# Patient Record
Sex: Female | Born: 1970 | Race: White | Hispanic: No | Marital: Married | Smoking: Never smoker
Health system: Southern US, Community
[De-identification: ages and names within clinical notes are randomized; demographics above are authoritative.]

## PROBLEM LIST (undated history)

## (undated) DIAGNOSIS — R519 Headache, unspecified: Secondary | ICD-10-CM

## (undated) DIAGNOSIS — R51 Headache: Secondary | ICD-10-CM

## (undated) DIAGNOSIS — R112 Nausea with vomiting, unspecified: Secondary | ICD-10-CM

## (undated) DIAGNOSIS — N649 Disorder of breast, unspecified: Secondary | ICD-10-CM

## (undated) DIAGNOSIS — Z9889 Other specified postprocedural states: Secondary | ICD-10-CM

## (undated) HISTORY — PX: WISDOM TOOTH EXTRACTION: SHX21

---

## 2006-05-02 ENCOUNTER — Encounter: Admission: RE | Admit: 2006-05-02 | Discharge: 2006-05-02 | Payer: Self-pay | Admitting: Family Medicine

## 2007-06-05 ENCOUNTER — Encounter: Admission: RE | Admit: 2007-06-05 | Discharge: 2007-06-05 | Payer: Self-pay | Admitting: Family Medicine

## 2007-06-12 ENCOUNTER — Encounter: Admission: RE | Admit: 2007-06-12 | Discharge: 2007-06-12 | Payer: Self-pay | Admitting: Family Medicine

## 2007-12-26 ENCOUNTER — Encounter: Admission: RE | Admit: 2007-12-26 | Discharge: 2007-12-26 | Payer: Self-pay | Admitting: Family Medicine

## 2008-07-16 ENCOUNTER — Encounter: Admission: RE | Admit: 2008-07-16 | Discharge: 2008-07-16 | Payer: Self-pay | Admitting: Family Medicine

## 2012-08-01 ENCOUNTER — Other Ambulatory Visit: Payer: Self-pay | Admitting: Obstetrics and Gynecology

## 2012-08-07 ENCOUNTER — Other Ambulatory Visit: Payer: Self-pay | Admitting: *Deleted

## 2012-08-07 DIAGNOSIS — N63 Unspecified lump in unspecified breast: Secondary | ICD-10-CM

## 2012-08-14 ENCOUNTER — Other Ambulatory Visit: Payer: Self-pay | Admitting: *Deleted

## 2012-08-14 ENCOUNTER — Ambulatory Visit
Admission: RE | Admit: 2012-08-14 | Discharge: 2012-08-14 | Disposition: A | Discharge status: Home / Self Care | Payer: BC Managed Care – PPO | Source: Ambulatory Visit | Attending: *Deleted | Admitting: *Deleted

## 2012-08-14 DIAGNOSIS — N63 Unspecified lump in unspecified breast: Secondary | ICD-10-CM

## 2012-08-22 ENCOUNTER — Ambulatory Visit
Admission: RE | Admit: 2012-08-22 | Discharge: 2012-08-22 | Disposition: A | Discharge status: Home / Self Care | Payer: BC Managed Care – PPO | Source: Ambulatory Visit | Attending: *Deleted | Admitting: *Deleted

## 2012-08-22 DIAGNOSIS — N63 Unspecified lump in unspecified breast: Secondary | ICD-10-CM

## 2013-08-06 ENCOUNTER — Other Ambulatory Visit: Payer: Self-pay

## 2013-08-06 DIAGNOSIS — Z1231 Encounter for screening mammogram for malignant neoplasm of breast: Secondary | ICD-10-CM

## 2013-09-03 ENCOUNTER — Ambulatory Visit
Admission: RE | Admit: 2013-09-03 | Discharge: 2013-09-03 | Disposition: A | Discharge status: Home / Self Care | Payer: BC Managed Care – PPO | Source: Ambulatory Visit

## 2013-09-03 DIAGNOSIS — Z1231 Encounter for screening mammogram for malignant neoplasm of breast: Secondary | ICD-10-CM

## 2013-09-04 ENCOUNTER — Other Ambulatory Visit: Payer: Self-pay | Admitting: Obstetrics & Gynecology

## 2013-09-04 DIAGNOSIS — R928 Other abnormal and inconclusive findings on diagnostic imaging of breast: Secondary | ICD-10-CM

## 2013-09-24 ENCOUNTER — Ambulatory Visit
Admission: RE | Admit: 2013-09-24 | Discharge: 2013-09-24 | Disposition: A | Discharge status: Home / Self Care | Payer: BC Managed Care – PPO | Source: Ambulatory Visit | Attending: Obstetrics & Gynecology | Admitting: Obstetrics & Gynecology

## 2013-09-24 DIAGNOSIS — R928 Other abnormal and inconclusive findings on diagnostic imaging of breast: Secondary | ICD-10-CM

## 2014-02-19 ENCOUNTER — Other Ambulatory Visit: Payer: Self-pay | Admitting: Obstetrics & Gynecology

## 2014-02-19 DIAGNOSIS — N6459 Other signs and symptoms in breast: Secondary | ICD-10-CM

## 2014-03-18 ENCOUNTER — Ambulatory Visit
Admission: RE | Admit: 2014-03-18 | Discharge: 2014-03-18 | Disposition: A | Discharge status: Home / Self Care | Payer: BC Managed Care – PPO | Source: Ambulatory Visit | Attending: Obstetrics & Gynecology | Admitting: Obstetrics & Gynecology

## 2014-03-18 ENCOUNTER — Other Ambulatory Visit: Payer: Self-pay | Admitting: Obstetrics & Gynecology

## 2014-03-18 DIAGNOSIS — N6459 Other signs and symptoms in breast: Secondary | ICD-10-CM

## 2014-03-20 ENCOUNTER — Other Ambulatory Visit: Payer: Self-pay | Admitting: Obstetrics & Gynecology

## 2014-03-20 DIAGNOSIS — R928 Other abnormal and inconclusive findings on diagnostic imaging of breast: Secondary | ICD-10-CM

## 2014-08-29 ENCOUNTER — Other Ambulatory Visit: Payer: Self-pay | Admitting: Obstetrics & Gynecology

## 2014-08-29 DIAGNOSIS — R928 Other abnormal and inconclusive findings on diagnostic imaging of breast: Secondary | ICD-10-CM

## 2014-09-04 ENCOUNTER — Ambulatory Visit
Admission: RE | Admit: 2014-09-04 | Discharge: 2014-09-04 | Disposition: A | Discharge status: Home / Self Care | Payer: BC Managed Care – PPO | Source: Ambulatory Visit | Attending: Obstetrics & Gynecology | Admitting: Obstetrics & Gynecology

## 2014-09-04 ENCOUNTER — Other Ambulatory Visit: Payer: Self-pay | Admitting: Obstetrics & Gynecology

## 2014-09-04 DIAGNOSIS — N6320 Unspecified lump in the left breast, unspecified quadrant: Secondary | ICD-10-CM

## 2014-09-04 DIAGNOSIS — R928 Other abnormal and inconclusive findings on diagnostic imaging of breast: Secondary | ICD-10-CM

## 2014-09-06 ENCOUNTER — Other Ambulatory Visit: Payer: Self-pay | Admitting: Obstetrics & Gynecology

## 2014-09-06 DIAGNOSIS — N6320 Unspecified lump in the left breast, unspecified quadrant: Secondary | ICD-10-CM

## 2014-09-10 ENCOUNTER — Ambulatory Visit
Admission: RE | Admit: 2014-09-10 | Discharge: 2014-09-10 | Disposition: A | Discharge status: Home / Self Care | Payer: BC Managed Care – PPO | Source: Ambulatory Visit | Attending: Obstetrics & Gynecology | Admitting: Obstetrics & Gynecology

## 2014-09-10 DIAGNOSIS — N6320 Unspecified lump in the left breast, unspecified quadrant: Secondary | ICD-10-CM

## 2014-10-18 HISTORY — PX: BREAST EXCISIONAL BIOPSY: SUR124

## 2015-01-15 ENCOUNTER — Other Ambulatory Visit: Payer: Self-pay

## 2015-01-15 DIAGNOSIS — N649 Disorder of breast, unspecified: Secondary | ICD-10-CM

## 2015-04-03 ENCOUNTER — Other Ambulatory Visit: Payer: Self-pay | Admitting: Obstetrics & Gynecology

## 2015-04-03 DIAGNOSIS — N649 Disorder of breast, unspecified: Secondary | ICD-10-CM

## 2015-04-22 ENCOUNTER — Other Ambulatory Visit: Payer: Self-pay | Admitting: Obstetrics & Gynecology

## 2015-04-22 ENCOUNTER — Ambulatory Visit
Admission: RE | Admit: 2015-04-22 | Discharge: 2015-04-22 | Disposition: A | Discharge status: Home / Self Care | Payer: BLUE CROSS/BLUE SHIELD | Source: Ambulatory Visit | Attending: Obstetrics & Gynecology | Admitting: Obstetrics & Gynecology

## 2015-04-22 ENCOUNTER — Ambulatory Visit
Admission: RE | Admit: 2015-04-22 | Discharge: 2015-04-22 | Disposition: A | Discharge status: Home / Self Care | Payer: BLUE CROSS/BLUE SHIELD | Source: Ambulatory Visit

## 2015-04-22 DIAGNOSIS — N649 Disorder of breast, unspecified: Secondary | ICD-10-CM

## 2015-06-09 ENCOUNTER — Other Ambulatory Visit: Payer: Self-pay | Admitting: General Surgery

## 2015-06-09 ENCOUNTER — Encounter (HOSPITAL_BASED_OUTPATIENT_CLINIC_OR_DEPARTMENT_OTHER): Payer: Self-pay | Admitting: *Deleted

## 2015-06-09 DIAGNOSIS — N6320 Unspecified lump in the left breast, unspecified quadrant: Secondary | ICD-10-CM

## 2015-07-04 ENCOUNTER — Ambulatory Visit
Admission: RE | Admit: 2015-07-04 | Discharge: 2015-07-04 | Disposition: A | Discharge status: Home / Self Care | Payer: BLUE CROSS/BLUE SHIELD | Source: Ambulatory Visit | Attending: General Surgery | Admitting: General Surgery

## 2015-07-04 ENCOUNTER — Encounter (HOSPITAL_BASED_OUTPATIENT_CLINIC_OR_DEPARTMENT_OTHER)
Admission: RE | Admit: 2015-07-04 | Discharge: 2015-07-04 | Disposition: A | Discharge status: Home / Self Care | Payer: BLUE CROSS/BLUE SHIELD | Source: Ambulatory Visit | Attending: General Surgery | Admitting: General Surgery

## 2015-07-04 DIAGNOSIS — N6320 Unspecified lump in the left breast, unspecified quadrant: Secondary | ICD-10-CM

## 2015-07-04 DIAGNOSIS — D0502 Lobular carcinoma in situ of left breast: Secondary | ICD-10-CM | POA: Diagnosis not present

## 2015-07-04 DIAGNOSIS — N63 Unspecified lump in breast: Secondary | ICD-10-CM | POA: Diagnosis present

## 2015-07-04 LAB — CBC WITH DIFFERENTIAL/PLATELET
Basophils Absolute: 0.1 10*3/uL (ref 0.0–0.1)
Basophils Relative: 1 %
Eosinophils Absolute: 0.1 10*3/uL (ref 0.0–0.7)
Eosinophils Relative: 2 %
HEMATOCRIT: 42.4 % (ref 36.0–46.0)
HEMOGLOBIN: 14.3 g/dL (ref 12.0–15.0)
LYMPHS PCT: 25 %
Lymphs Abs: 1.8 10*3/uL (ref 0.7–4.0)
MCH: 31.8 pg (ref 26.0–34.0)
MCHC: 33.7 g/dL (ref 30.0–36.0)
MCV: 94.4 fL (ref 78.0–100.0)
MONOS PCT: 9 %
Monocytes Absolute: 0.6 10*3/uL (ref 0.1–1.0)
NEUTROS ABS: 4.5 10*3/uL (ref 1.7–7.7)
NEUTROS PCT: 63 %
Platelets: 220 10*3/uL (ref 150–400)
RBC: 4.49 MIL/uL (ref 3.87–5.11)
RDW: 12.2 % (ref 11.5–15.5)
WBC: 7 10*3/uL (ref 4.0–10.5)

## 2015-07-04 LAB — BASIC METABOLIC PANEL
ANION GAP: 7 (ref 5–15)
BUN: 12 mg/dL (ref 6–20)
CALCIUM: 9.9 mg/dL (ref 8.9–10.3)
CHLORIDE: 104 mmol/L (ref 101–111)
CO2: 30 mmol/L (ref 22–32)
Creatinine, Ser: 0.96 mg/dL (ref 0.44–1.00)
GFR calc non Af Amer: >60 mL/min (ref >60)
GLUCOSE: 85 mg/dL (ref 65–99)
POTASSIUM: 3.9 mmol/L (ref 3.5–5.1)
Sodium: 141 mmol/L (ref 135–145)

## 2015-07-07 ENCOUNTER — Encounter (HOSPITAL_BASED_OUTPATIENT_CLINIC_OR_DEPARTMENT_OTHER)
Admission: RE | Disposition: A | Discharge status: Home / Self Care | Payer: Self-pay | Source: Ambulatory Visit | Attending: General Surgery

## 2015-07-07 ENCOUNTER — Ambulatory Visit (HOSPITAL_BASED_OUTPATIENT_CLINIC_OR_DEPARTMENT_OTHER): Payer: BLUE CROSS/BLUE SHIELD | Admitting: Certified Registered"

## 2015-07-07 ENCOUNTER — Ambulatory Visit
Admission: RE | Admit: 2015-07-07 | Discharge: 2015-07-07 | Disposition: A | Discharge status: Home / Self Care | Payer: BLUE CROSS/BLUE SHIELD | Source: Ambulatory Visit | Attending: General Surgery | Admitting: General Surgery

## 2015-07-07 ENCOUNTER — Ambulatory Visit (HOSPITAL_BASED_OUTPATIENT_CLINIC_OR_DEPARTMENT_OTHER)
Admission: RE | Admit: 2015-07-07 | Discharge: 2015-07-07 | Disposition: A | Discharge status: Home / Self Care | Payer: BLUE CROSS/BLUE SHIELD | Source: Ambulatory Visit | Attending: General Surgery | Admitting: General Surgery

## 2015-07-07 ENCOUNTER — Encounter (HOSPITAL_BASED_OUTPATIENT_CLINIC_OR_DEPARTMENT_OTHER): Payer: Self-pay

## 2015-07-07 DIAGNOSIS — N6320 Unspecified lump in the left breast, unspecified quadrant: Secondary | ICD-10-CM

## 2015-07-07 DIAGNOSIS — D0502 Lobular carcinoma in situ of left breast: Secondary | ICD-10-CM | POA: Diagnosis not present

## 2015-07-07 HISTORY — DX: Nausea with vomiting, unspecified: R11.2

## 2015-07-07 HISTORY — DX: Headache: R51

## 2015-07-07 HISTORY — PX: RADIOACTIVE SEED GUIDED EXCISIONAL BREAST BIOPSY: SHX6490

## 2015-07-07 HISTORY — DX: Headache, unspecified: R51.9

## 2015-07-07 HISTORY — DX: Other specified postprocedural states: Z98.890

## 2015-07-07 HISTORY — DX: Disorder of breast, unspecified: N64.9

## 2015-07-07 SURGERY — RADIOACTIVE SEED GUIDED BREAST BIOPSY
Anesthesia: General | Site: Breast | Laterality: Left

## 2015-07-07 MED ORDER — FENTANYL CITRATE (PF) 100 MCG/2ML IJ SOLN
50.0000 ug | INTRAMUSCULAR | Status: AC | PRN
  Administered 2015-07-07: 50 ug via INTRAVENOUS
  Administered 2015-07-07 (×2): 25 ug via INTRAVENOUS

## 2015-07-07 MED ORDER — ONDANSETRON HCL 4 MG/2ML IJ SOLN
INTRAMUSCULAR | Status: AC
  Filled 2015-07-07: qty 2

## 2015-07-07 MED ORDER — CEFAZOLIN SODIUM-DEXTROSE 2-3 GM-% IV SOLR
INTRAVENOUS | Status: AC
  Filled 2015-07-07: qty 50

## 2015-07-07 MED ORDER — GLYCOPYRROLATE 0.2 MG/ML IJ SOLN: 0.2000 mg | Freq: Once | INTRAMUSCULAR | Status: DC | PRN

## 2015-07-07 MED ORDER — LIDOCAINE HCL (CARDIAC) 20 MG/ML IV SOLN
INTRAVENOUS | Status: AC
  Filled 2015-07-07: qty 5

## 2015-07-07 MED ORDER — SCOPOLAMINE 1 MG/3DAYS TD PT72
1.0000 | MEDICATED_PATCH | Freq: Once | TRANSDERMAL | Status: DC | PRN
  Administered 2015-07-07: 1.5 mg via TRANSDERMAL

## 2015-07-07 MED ORDER — OXYCODONE HCL 5 MG PO TABS
ORAL_TABLET | ORAL | Status: AC
  Filled 2015-07-07: qty 1

## 2015-07-07 MED ORDER — BUPIVACAINE HCL (PF) 0.25 % IJ SOLN
INTRAMUSCULAR | Status: AC
  Filled 2015-07-07: qty 30

## 2015-07-07 MED ORDER — CEFAZOLIN SODIUM-DEXTROSE 2-3 GM-% IV SOLR
2.0000 g | INTRAVENOUS | Status: AC
Start: 2015-07-07 — End: 2015-07-07
  Administered 2015-07-07: 2 g via INTRAVENOUS

## 2015-07-07 MED ORDER — DEXAMETHASONE SODIUM PHOSPHATE 10 MG/ML IJ SOLN
INTRAMUSCULAR | Status: AC
  Filled 2015-07-07: qty 1

## 2015-07-07 MED ORDER — PROPOFOL 10 MG/ML IV BOLUS
INTRAVENOUS | Status: DC | PRN
  Administered 2015-07-07: 200 mg via INTRAVENOUS

## 2015-07-07 MED ORDER — MEPERIDINE HCL 25 MG/ML IJ SOLN: 6.2500 mg | INTRAMUSCULAR | Status: DC | PRN

## 2015-07-07 MED ORDER — HYDROMORPHONE HCL 1 MG/ML IJ SOLN
0.2500 mg | INTRAMUSCULAR | Status: DC | PRN
  Administered 2015-07-07: 0.5 mg via INTRAVENOUS

## 2015-07-07 MED ORDER — ONDANSETRON HCL 4 MG/2ML IJ SOLN
INTRAMUSCULAR | Status: DC | PRN
  Administered 2015-07-07: 4 mg via INTRAVENOUS

## 2015-07-07 MED ORDER — MIDAZOLAM HCL 2 MG/2ML IJ SOLN
INTRAMUSCULAR | Status: AC
  Filled 2015-07-07: qty 4

## 2015-07-07 MED ORDER — LACTATED RINGERS IV SOLN
INTRAVENOUS | Status: DC
  Administered 2015-07-07 (×2): via INTRAVENOUS

## 2015-07-07 MED ORDER — OXYCODONE HCL 5 MG PO TABS
5.0000 mg | ORAL_TABLET | Freq: Once | ORAL | Status: AC | PRN
  Administered 2015-07-07: 5 mg via ORAL

## 2015-07-07 MED ORDER — HYDROMORPHONE HCL 1 MG/ML IJ SOLN
INTRAMUSCULAR | Status: AC
  Filled 2015-07-07: qty 1

## 2015-07-07 MED ORDER — HYDROCODONE-ACETAMINOPHEN 10-325 MG PO TABS: 1.0000 | ORAL_TABLET | Freq: Four times a day (QID) | ORAL | Status: DC | PRN

## 2015-07-07 MED ORDER — SCOPOLAMINE 1 MG/3DAYS TD PT72
MEDICATED_PATCH | TRANSDERMAL | Status: AC
  Filled 2015-07-07: qty 1

## 2015-07-07 MED ORDER — MIDAZOLAM HCL 2 MG/2ML IJ SOLN
1.0000 mg | INTRAMUSCULAR | Status: DC | PRN
  Administered 2015-07-07: 2 mg via INTRAVENOUS

## 2015-07-07 MED ORDER — LIDOCAINE HCL (CARDIAC) 20 MG/ML IV SOLN
INTRAVENOUS | Status: DC | PRN
  Administered 2015-07-07: 60 mg via INTRAVENOUS

## 2015-07-07 MED ORDER — OXYCODONE HCL 5 MG/5ML PO SOLN: 5.0000 mg | Freq: Once | ORAL | Status: AC | PRN

## 2015-07-07 MED ORDER — PROPOFOL 500 MG/50ML IV EMUL
INTRAVENOUS | Status: AC
  Filled 2015-07-07: qty 50

## 2015-07-07 MED ORDER — FENTANYL CITRATE (PF) 100 MCG/2ML IJ SOLN
INTRAMUSCULAR | Status: AC
  Filled 2015-07-07: qty 4

## 2015-07-07 MED ORDER — BUPIVACAINE HCL (PF) 0.25 % IJ SOLN
INTRAMUSCULAR | Status: DC | PRN
  Administered 2015-07-07: 20 mL

## 2015-07-07 MED ORDER — DEXAMETHASONE SODIUM PHOSPHATE 4 MG/ML IJ SOLN
INTRAMUSCULAR | Status: DC | PRN
  Administered 2015-07-07: 10 mg via INTRAVENOUS

## 2015-07-07 SURGICAL SUPPLY — 63 items
APPLIER CLIP 9.375 MED OPEN (MISCELLANEOUS)
APR CLP MED 9.3 20 MLT OPN (MISCELLANEOUS)
BINDER BREAST LRG (GAUZE/BANDAGES/DRESSINGS) IMPLANT
BINDER BREAST MEDIUM (GAUZE/BANDAGES/DRESSINGS) ×2 IMPLANT
BINDER BREAST XLRG (GAUZE/BANDAGES/DRESSINGS) IMPLANT
BINDER BREAST XXLRG (GAUZE/BANDAGES/DRESSINGS) IMPLANT
BLADE SURG 15 STRL LF DISP TIS (BLADE) ×1 IMPLANT
BLADE SURG 15 STRL SS (BLADE) ×3
CANISTER SUC SOCK COL 7IN (MISCELLANEOUS) IMPLANT
CANISTER SUCT 1200ML W/VALVE (MISCELLANEOUS) IMPLANT
CHLORAPREP W/TINT 26ML (MISCELLANEOUS) ×3 IMPLANT
CLIP APPLIE 9.375 MED OPEN (MISCELLANEOUS) IMPLANT
CLOSURE WOUND 1/2 X4 (GAUZE/BANDAGES/DRESSINGS) ×1
COVER BACK TABLE 60X90IN (DRAPES) ×3 IMPLANT
COVER MAYO STAND STRL (DRAPES) ×3 IMPLANT
COVER PROBE W GEL 5X96 (DRAPES) ×3 IMPLANT
DECANTER SPIKE VIAL GLASS SM (MISCELLANEOUS) IMPLANT
DEVICE DUBIN W/COMP PLATE 8390 (MISCELLANEOUS) ×3 IMPLANT
DRAPE LAPAROSCOPIC ABDOMINAL (DRAPES) ×3 IMPLANT
DRAPE UTILITY XL STRL (DRAPES) ×2 IMPLANT
DRSG TEGADERM 4X4.75 (GAUZE/BANDAGES/DRESSINGS) IMPLANT
ELECT COATED BLADE 2.86 ST (ELECTRODE) ×3 IMPLANT
ELECT REM PT RETURN 9FT ADLT (ELECTROSURGICAL) ×3
ELECTRODE REM PT RTRN 9FT ADLT (ELECTROSURGICAL) ×1 IMPLANT
GLOVE BIO SURGEON STRL SZ7 (GLOVE) ×6 IMPLANT
GLOVE BIOGEL PI IND STRL 7.0 (GLOVE) IMPLANT
GLOVE BIOGEL PI IND STRL 7.5 (GLOVE) ×1 IMPLANT
GLOVE BIOGEL PI INDICATOR 7.0 (GLOVE) ×2
GLOVE BIOGEL PI INDICATOR 7.5 (GLOVE) ×2
GLOVE ECLIPSE 6.5 STRL STRAW (GLOVE) ×2 IMPLANT
GLOVE EXAM NITRILE EXT CUFF MD (GLOVE) ×2 IMPLANT
GOWN STRL REUS W/ TWL LRG LVL3 (GOWN DISPOSABLE) ×2 IMPLANT
GOWN STRL REUS W/TWL LRG LVL3 (GOWN DISPOSABLE) ×6
ILLUMINATOR WAVEGUIDE N/F (MISCELLANEOUS) ×2 IMPLANT
KIT MARKER MARGIN INK (KITS) ×3 IMPLANT
LIGHT WAVEGUIDE WIDE FLAT (MISCELLANEOUS) IMPLANT
LIQUID BAND (GAUZE/BANDAGES/DRESSINGS) ×3 IMPLANT
MARKER SKIN DUAL TIP RULER LAB (MISCELLANEOUS) ×3 IMPLANT
NDL HYPO 25X1 1.5 SAFETY (NEEDLE) ×1 IMPLANT
NEEDLE HYPO 25X1 1.5 SAFETY (NEEDLE) ×3 IMPLANT
NS IRRIG 1000ML POUR BTL (IV SOLUTION) ×2 IMPLANT
PACK BASIN DAY SURGERY FS (CUSTOM PROCEDURE TRAY) ×3 IMPLANT
PENCIL BUTTON HOLSTER BLD 10FT (ELECTRODE) ×3 IMPLANT
SHEET MEDIUM DRAPE 40X70 STRL (DRAPES) IMPLANT
SLEEVE SCD COMPRESS KNEE MED (MISCELLANEOUS) ×3 IMPLANT
SPONGE GAUZE 4X4 12PLY STER LF (GAUZE/BANDAGES/DRESSINGS) IMPLANT
SPONGE LAP 4X18 X RAY DECT (DISPOSABLE) ×3 IMPLANT
STAPLER VISISTAT 35W (STAPLE) IMPLANT
STRIP CLOSURE SKIN 1/2X4 (GAUZE/BANDAGES/DRESSINGS) ×2 IMPLANT
SUT MNCRL AB 4-0 PS2 18 (SUTURE) ×2 IMPLANT
SUT MON AB 5-0 PS2 18 (SUTURE) IMPLANT
SUT SILK 2 0 SH (SUTURE) IMPLANT
SUT VIC AB 2-0 SH 27 (SUTURE) ×3
SUT VIC AB 2-0 SH 27XBRD (SUTURE) ×1 IMPLANT
SUT VIC AB 3-0 SH 27 (SUTURE) ×3
SUT VIC AB 3-0 SH 27X BRD (SUTURE) ×1 IMPLANT
SUT VIC AB 5-0 PS2 18 (SUTURE) ×2 IMPLANT
SYR CONTROL 10ML LL (SYRINGE) ×3 IMPLANT
TOWEL OR 17X24 6PK STRL BLUE (TOWEL DISPOSABLE) ×3 IMPLANT
TOWEL OR NON WOVEN STRL DISP B (DISPOSABLE) ×3 IMPLANT
TUBE CONNECTING 20'X1/4 (TUBING)
TUBE CONNECTING 20X1/4 (TUBING) IMPLANT
YANKAUER SUCT BULB TIP NO VENT (SUCTIONS) IMPLANT

## 2015-07-07 NOTE — Anesthesia Procedure Notes (Signed)
Procedure Name: LMA Insertion Date/Time: 07/07/2015 7:33 AM Performed by: BLOCKER, TIMOTHY D Pre-anesthesia Checklist: Patient identified, Emergency Drugs available, Suction available and Patient being monitored Patient Re-evaluated:Patient Re-evaluated prior to inductionOxygen Delivery Method: Circle System Utilized Preoxygenation: Pre-oxygenation with 100% oxygen Intubation Type: IV induction Ventilation: Mask ventilation without difficulty LMA: LMA inserted LMA Size: 4.0 Number of attempts: 1 Airway Equipment and Method: Bite block Placement Confirmation: positive ETCO2 Tube secured with: Tape Dental Injury: Teeth and Oropharynx as per pre-operative assessment

## 2015-07-07 NOTE — Anesthesia Postprocedure Evaluation (Signed)
  Anesthesia Post-op Note  Patient: Darlene Perez  Procedure(s) Performed: Procedure(s): RADIOACTIVE SEED GUIDED EXCISIONAL LEFT BREAST BIOPSY (Left)  Patient Location: PACU  Anesthesia Type: General   Level of Consciousness: awake, alert  and oriented  Airway and Oxygen Therapy: Patient Spontanous Breathing  Post-op Pain: mild  Post-op Assessment: Post-op Vital signs reviewed  Post-op Vital Signs: Reviewed  Last Vitals:  Filed Vitals:   07/07/15 1015  BP: 125/80  Pulse: 75  Temp: 36.9 C  Resp: 16    Complications: No apparent anesthesia complications

## 2015-07-07 NOTE — Transfer of Care (Signed)
Immediate Anesthesia Transfer of Care Note  Patient: Darlene Perez  Procedure(s) Performed: Procedure(s): RADIOACTIVE SEED GUIDED EXCISIONAL LEFT BREAST BIOPSY (Left)  Patient Location: PACU  Anesthesia Type:General  Level of Consciousness: awake, alert , oriented and patient cooperative  Airway & Oxygen Therapy: Patient Spontanous Breathing and Patient connected to face mask oxygen  Post-op Assessment: Report given to RN and Post -op Vital signs reviewed and stable  Post vital signs: Reviewed and stable  Last Vitals:  Filed Vitals:   07/07/15 0825  BP: 127/77  Pulse:   Temp:   Resp:     Complications: No apparent anesthesia complications

## 2015-07-07 NOTE — Anesthesia Preprocedure Evaluation (Addendum)
Anesthesia Evaluation  Patient identified by MRN, date of birth, ID band Patient awake    Reviewed: Allergy & Precautions, NPO status , Patient's Chart, lab work & pertinent test results  History of Anesthesia Complications (+) PONV and history of anesthetic complications  Airway Mallampati: I  TM Distance: >3 FB Neck ROM: Full    Dental  (+) Teeth Intact, Dental Advisory Given   Pulmonary    breath sounds clear to auscultation       Cardiovascular  Rhythm:Regular Rate:Normal     Neuro/Psych  Headaches,    GI/Hepatic   Endo/Other    Renal/GU      Musculoskeletal   Abdominal   Peds  Hematology   Anesthesia Other Findings   Reproductive/Obstetrics                            Anesthesia Physical Anesthesia Plan  ASA: II  Anesthesia Plan: General   Post-op Pain Management:    Induction: Intravenous  Airway Management Planned: LMA  Additional Equipment:   Intra-op Plan:   Post-operative Plan: Extubation in OR  Informed Consent: I have reviewed the patients History and Physical, chart, labs and discussed the procedure including the risks, benefits and alternatives for the proposed anesthesia with the patient or authorized representative who has indicated his/her understanding and acceptance.   Dental advisory given  Plan Discussed with: CRNA, Anesthesiologist and Surgeon  Anesthesia Plan Comments:         Anesthesia Quick Evaluation

## 2015-07-07 NOTE — H&P (Signed)
44 yof with history of left breast cysts, no fh of breast or ovarian cancer presents after undergoing screening mm 6/15 that showed a left breast distortion in uoq. US showed a 2.2 cm area. she underwent abbreviated mr in [place] that showed nl right breast, left breast with 3.2x4.4x2 cm area of NME centered at 12 o clock. this is 3.8 cm anterior to CW, 1.7 cm deep to skin and is 3.5 cm from nipple. nodes negative. 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 seen by one of my partners previously and decided to not have surgery as discussed for a number of reasons. repeat mm this year in July shows a 4.6x4.2x3.7 cm area of distortion in the upper left breast. on [place] there is a 2.7x2.7x2.5 cm area of abnormality. nodes again negative. she does not really identify a mass, no nipple discharge. presents for conversation on excision   Other Problems Illene Regulus, CMA; 06/09/2015 1:49 PM) Hemorrhoids Lump In Breast Melanoma Migraine Headache  Past Surgical History Illene Regulus, CMA; 06/09/2015 1:49 PM) Breast Biopsy Left. Cesarean Section - 1 Oral Surgery  Diagnostic Studies History Illene Regulus, CMA; 06/09/2015 1:49 PM) Colonoscopy never Mammogram within last year Pap Smear 1-5 years ago  Medication History Illene Regulus, CMA; 06/09/2015 1:50 PM) No Current Medications Medications Reconciled  Social History Illene Regulus, CMA; 06/09/2015 1:49 PM) Alcohol use Occasional alcohol use. Caffeine use Carbonated beverages. No drug use Tobacco use Never smoker.  Family History Illene Regulus, CMA; 06/09/2015 1:49 PM) Arthritis Father. Hypertension Brother, Father, Mother, Sister.  Pregnancy / Birth History Illene Regulus, CMA; 06/09/2015 1:49 PM) Age at menarche 44 years. Contraceptive History Oral contraceptives. Gravida 2 Maternal age 30-30 Para 1 Regular periods  Review of Systems Lars Mage Spillers CMA; 06/09/2015 1:49  PM) General Not Present- Appetite Loss, Chills, Fatigue, Fever, Night Sweats, Weight Gain and Weight Loss. Skin Not Present- Change in Wart/Mole, Dryness, Hives, Jaundice, New Lesions, Non-Healing Wounds, Rash and Ulcer. HEENT Not Present- Earache, Hearing Loss, Hoarseness, Nose Bleed, Oral Ulcers, Ringing in the Ears, Seasonal Allergies, Sinus Pain, Sore Throat, Visual Disturbances, Wears glasses/contact lenses and Yellow Eyes. Respiratory Not Present- Bloody sputum, Chronic Cough, Difficulty Breathing, Snoring and Wheezing. Breast Present- Breast Mass. Not Present- Breast Pain, Nipple Discharge and Skin Changes. Cardiovascular Not Present- Chest Pain, Difficulty Breathing Lying Down, Leg Cramps, Palpitations, Rapid Heart Rate, Shortness of Breath and Swelling of Extremities. Gastrointestinal Not Present- Abdominal Pain, Bloating, Bloody Stool, Change in Bowel Habits, Chronic diarrhea, Constipation, Difficulty Swallowing, Excessive gas, Gets full quickly at meals, Hemorrhoids, Indigestion, Nausea, Rectal Pain and Vomiting. Female Genitourinary Not Present- Frequency, Nocturia, Painful Urination, Pelvic Pain and Urgency. Musculoskeletal Not Present- Back Pain, Joint Pain, Joint Stiffness, Muscle Pain, Muscle Weakness and Swelling of Extremities. Neurological Not Present- Decreased Memory, Fainting, Headaches, Numbness, Seizures, Tingling, Tremor, Trouble walking and Weakness. Psychiatric Not Present- Anxiety, Bipolar, Change in Sleep Pattern, Depression, Fearful and Frequent crying. Endocrine Not Present- Cold Intolerance, Excessive Hunger, Hair Changes, Heat Intolerance, Hot flashes and New Diabetes. Hematology Not Present- Easy Bruising, Excessive bleeding, Gland problems, HIV and Persistent Infections.   Vitals (Alisha Spillers CMA; 06/09/2015 1:49 PM) 06/09/2015 1:49 PM Weight: 147 lb Height: 69in Body Surface Area: 1.8 m Body Mass Index: 21.71 kg/m Pulse: 90 (Regular)  BP: 122/82  (Sitting, Left Arm, Standard)    Physical Exam Rolm Bookbinder MD; 06/09/2015 2:26 PM) General Mental Status-Alert. Orientation-Oriented X3.  Chest and Lung Exam Chest and lung exam reveals -on auscultation, normal  breath sounds, no adventitious sounds and normal vocal resonance.  Breast Nipples-No Discharge. Breast Lump-No Palpable Breast Mass.  Cardiovascular Cardiovascular examination reveals -normal heart sounds, regular rate and rhythm with no murmurs.    Assessment & Plan Rolm Bookbinder MD; 06/09/2015 2:44 PM) MASS OF LEFT BREAST ON MAMMOGRAM ([phone]  [id number]) Story: left breast seed guided excisional biopsy I recommended excision of the mass seen on [place]. we can do with seed guidance. I discussed possibility of finding, atypia, dcis or even cancer as being somewhere around 15-20%. It is encouraging this has not changed alot in a year though. I think likely done via periareolar incision and wont change breast shape. we discussed excising area and then awaiting final path before proceeding with anything else.

## 2015-07-07 NOTE — Discharge Instructions (Signed)
[facility] Office Phone Number [phone]  POST OP INSTRUCTIONS  Always review your discharge instruction sheet given to you by the facility where your surgery was performed.  IF YOU HAVE DISABILITY OR FAMILY LEAVE FORMS, YOU MUST BRING THEM TO THE OFFICE FOR PROCESSING.  DO NOT GIVE THEM TO YOUR DOCTOR.  1. A prescription for pain medication may be given to you upon discharge.  Take your pain medication as prescribed, if needed.  If narcotic pain medicine is not needed, then you may take acetaminophen (Tylenol), naprosyn (Alleve) or ibuprofen (Advil) as needed. 2. Take your usually prescribed medications unless otherwise directed 3. If you need a refill on your pain medication, please contact your pharmacy.  They will contact our office to request authorization.  Prescriptions will not be filled after 5pm or on week-ends. 4. You should eat very light the first 24 hours after surgery, such as soup, crackers, pudding, etc.  Resume your normal diet the day after surgery. 5. Most patients will experience some swelling and bruising in the breast.  Ice packs and a good support bra will help.  Wear the breast binder provided or a sports bra for 72 hours day and night.  After that wear a sports bra during the day until you return to the office. Swelling and bruising can take several days to resolve.  6. It is common to experience some constipation if taking pain medication after surgery.  Increasing fluid intake and taking a stool softener will usually help or prevent this problem from occurring.  A mild laxative (Milk of Magnesia or Miralax) should be taken according to package directions if there are no bowel movements after 48 hours. 7. Unless discharge instructions indicate otherwise, you may remove your bandages 48 hours after surgery and you may shower at that time.  You may have steri-strips (small skin tapes) in place directly over the incision.  These strips should be left on the  skin for 7-10 days and will come off on their own.  If your surgeon used skin glue on the incision, you may shower in 24 hours.  The glue will flake off over the next 2-3 weeks.  Any sutures or staples will be removed at the office during your follow-up visit. 8. ACTIVITIES:  You may resume regular daily activities (gradually increasing) beginning the next day.  Wearing a good support bra or sports bra minimizes pain and swelling.  You may have sexual intercourse when it is comfortable. a. You may drive when you no longer are taking prescription pain medication, you can comfortably wear a seatbelt, and you can safely maneuver your car and apply brakes. b. RETURN TO WORK:  ______________________________________________________________________________________ 9. You should see your doctor in the office for a follow-up appointment approximately two weeks after your surgery.  Your doctors nurse will typically make your follow-up appointment when she calls you with your pathology report.  Expect your pathology report 3-4 business days after your surgery.  You may call to check if you do not hear from [place] after three days. 10. OTHER INSTRUCTIONS: _______________________________________________________________________________________________ _____________________________________________________________________________________________________________________________________ _____________________________________________________________________________________________________________________________________ _____________________________________________________________________________________________________________________________________  WHEN TO CALL DR WAKEFIELD: 1. Fever over 101.0 2. Nausea and/or vomiting. 3. Extreme swelling or bruising. 4. Continued bleeding from incision. 5. Increased pain, redness, or drainage from the incision.  The clinic staff is available to answer your questions during regular  business hours.  Please dont hesitate to call and ask to speak to one of the nurses for clinical concerns.  If  you have a medical emergency, go to the nearest emergency room or call 911.  A surgeon from [facility] is always on call at the hospital.  For further questions, please visit [email or web address]     Post Anesthesia Home Care Instructions  Activity: Get plenty of rest for the remainder of the day. A responsible adult should stay with you for 24 hours following the procedure.  For the next 24 hours, DO NOT: -Drive a car -Paediatric nurse -Drink alcoholic beverages -Take any medication unless instructed by your physician -Make any legal decisions or sign important papers.  Meals: Start with liquid foods such as gelatin or soup. Progress to regular foods as tolerated. Avoid greasy, spicy, heavy foods. If nausea and/or vomiting occur, drink only clear liquids until the nausea and/or vomiting subsides. Call your physician if vomiting continues.  Special Instructions/Symptoms: Your throat may feel dry or sore from the anesthesia or the breathing tube placed in your throat during surgery. If this causes discomfort, gargle with warm salt water. The discomfort should disappear within 24 hours.  If you had a scopolamine patch placed behind your ear for the management of post- operative nausea and/or vomiting:  1. The medication in the patch is effective for 72 hours, after which it should be removed.  Wrap patch in a tissue and discard in the trash. Wash hands thoroughly with soap and water. 2. You may remove the patch earlier than 72 hours if you experience unpleasant side effects which may include dry mouth, dizziness or visual disturbances. 3. Avoid touching the patch. Wash your hands with soap and water after contact with the patch.

## 2015-07-07 NOTE — Op Note (Signed)
Preoperative diagnosis: left breast CSL Postoperative diagnosis: same as above Procedure: left breast radioactive seed guided excisional biopsy Surgeon: Dr Serita Grammes Anesthesia: general EBL: minimal Drains none Complications none Specimen left breast tissue marked with paint Sponge and needle count was correct to completion Suspicion to recovery stable  Indications: This is 39 yof with mammographically and MRI found breast mass with associated NME. This has been followed per patient choice. It has gotten larger and she now desires excision.  We discussed rsl excision of the mass.  The seed was placed prior to beginning. I had the mamograms in the OR  Procedure: After informed consent was obtained the patient was taken to the operating room. She was given antibiotics. Sequential compression devices were on her legs. She was then placed under general anesthesia without complication. She was prepped and draped in the standard sterile surgical fashion. A surgical timeout was then performed.  I infiltrated quarter percent Marcaine around the areola. I then made a periareolar incision. I then used the neoprobe to identify the radioactive seed and then remove this. Hemostasis was observed. I painted the specimen. A mammogram was taken confirming removal of the clip as well as the radioactive seed. There was no radioactivity remaining in the breast. I then closed this with 2-0 Vicryl, 3-0 Vicryl, and 5-0 Monocryl. Glue was placed over the wound. A breast binder was placed. She tolerated this well was extubated and transferred to recovery in stable condition

## 2015-07-07 NOTE — Interval H&P Note (Signed)
History and Physical Interval Note:  07/07/2015 7:22 AM  Darlene Perez  has presented today for surgery, with the diagnosis of LEFT BREAST COMPLEX SCLEROSING LESION  The various methods of treatment have been discussed with the patient and family. After consideration of risks, benefits and other options for treatment, the patient has consented to  Procedure(s): RADIOACTIVE SEED GUIDED EXCISIONAL LEFT BREAST BIOPSY (Left) as a surgical intervention .  The patient's history has been reviewed, patient examined, no change in status, stable for surgery.  I have reviewed the patient's chart and labs.  Questions were answered to the patient's satisfaction.     WAKEFIELD,MATTHEW

## 2015-07-08 ENCOUNTER — Encounter (HOSPITAL_BASED_OUTPATIENT_CLINIC_OR_DEPARTMENT_OTHER): Payer: Self-pay | Admitting: General Surgery

## 2015-07-28 ENCOUNTER — Telehealth: Payer: Self-pay | Admitting: *Deleted

## 2015-07-28 NOTE — Telephone Encounter (Signed)
Received referral from [facility] requesting pt to see Dr. Jana Hakim for High Risk.  Called pt and confirmed 08/26/15 high risk appt w/ her.  Mailed calendar, welcoming packet & intake form to pt.  Emailed Shavon at [place] to make her aware.  Emailed [employer] for tracking.  Placed a copy of the records in Dr. Virgie Dad box and took one to HIM to scan.

## 2015-08-25 ENCOUNTER — Other Ambulatory Visit: Payer: Self-pay

## 2015-08-26 ENCOUNTER — Ambulatory Visit (HOSPITAL_BASED_OUTPATIENT_CLINIC_OR_DEPARTMENT_OTHER): Payer: BLUE CROSS/BLUE SHIELD | Admitting: Oncology

## 2015-08-26 ENCOUNTER — Other Ambulatory Visit: Payer: Self-pay | Admitting: *Deleted

## 2015-08-26 ENCOUNTER — Other Ambulatory Visit (HOSPITAL_BASED_OUTPATIENT_CLINIC_OR_DEPARTMENT_OTHER): Payer: BLUE CROSS/BLUE SHIELD

## 2015-08-26 VITALS — BP 140/85 | HR 85 | Temp 98.7°F | Resp 18 | Ht 69.0 in | Wt 150.8 lb

## 2015-08-26 DIAGNOSIS — D0502 Lobular carcinoma in situ of left breast: Secondary | ICD-10-CM

## 2015-08-26 DIAGNOSIS — N6092 Unspecified benign mammary dysplasia of left breast: Secondary | ICD-10-CM | POA: Insufficient documentation

## 2015-08-26 DIAGNOSIS — Z85820 Personal history of malignant melanoma of skin: Secondary | ICD-10-CM | POA: Diagnosis not present

## 2015-08-26 DIAGNOSIS — D0501 Lobular carcinoma in situ of right breast: Secondary | ICD-10-CM

## 2015-08-26 LAB — COMPREHENSIVE METABOLIC PANEL (CC13)
ALBUMIN: 4 g/dL (ref 3.5–5.0)
ALK PHOS: 74 U/L (ref 40–150)
ALT: 14 U/L (ref 0–55)
ANION GAP: 6 meq/L (ref 3–11)
AST: 15 U/L (ref 5–34)
BUN: 13.5 mg/dL (ref 7.0–26.0)
CALCIUM: 9.4 mg/dL (ref 8.4–10.4)
CO2: 29 mEq/L (ref 22–29)
Chloride: 108 mEq/L (ref 98–109)
Creatinine: 1.1 mg/dL (ref 0.6–1.1)
EGFR: 64 mL/min/{1.73_m2} — AB (ref >90)
Glucose: 104 mg/dl (ref 70–140)
Potassium: 3.9 mEq/L (ref 3.5–5.1)
Sodium: 142 mEq/L (ref 136–145)
TOTAL PROTEIN: 6.6 g/dL (ref 6.4–8.3)

## 2015-08-26 LAB — CBC WITH DIFFERENTIAL/PLATELET
BASO%: 1.2 % (ref 0.0–2.0)
Basophils Absolute: 0.1 10*3/uL (ref 0.0–0.1)
EOS ABS: 0.2 10*3/uL (ref 0.0–0.5)
EOS%: 2.6 % (ref 0.0–7.0)
HCT: 40.6 % (ref 34.8–46.6)
HEMOGLOBIN: 13.6 g/dL (ref 11.6–15.9)
LYMPH#: 1.9 10*3/uL (ref 0.9–3.3)
LYMPH%: 26.9 % (ref 14.0–49.7)
MCH: 31.6 pg (ref 25.1–34.0)
MCHC: 33.5 g/dL (ref 31.5–36.0)
MCV: 94.2 fL (ref 79.5–101.0)
MONO#: 0.4 10*3/uL (ref 0.1–0.9)
MONO%: 6.4 % (ref 0.0–14.0)
NEUT%: 62.9 % (ref 38.4–76.8)
NEUTROS ABS: 4.4 10*3/uL (ref 1.5–6.5)
PLATELETS: 231 10*3/uL (ref 145–400)
RBC: 4.31 10*6/uL (ref 3.70–5.45)
RDW: 12.7 % (ref 11.2–14.5)
WBC: 7 10*3/uL (ref 3.9–10.3)

## 2015-08-26 NOTE — Progress Notes (Signed)
[place]  [phone] [phone]     ID: ELEAH LAHAIE DOB: 1970/12/11  MR#: [id number]  ZSW#:[id number]  Patient Care Team: Chriss Czar, MD as PCP - General (Family Medicine) Chauncey Cruel, MD as Consulting Physician (Oncology) Rolm Bookbinder, MD as Consulting Physician (General Surgery) PCP: Chriss Czar, MD GYN: Hilda Lias MD OTHER MD:  CHIEF COMPLAINT: at high risk for breast cancer  CURRENT TREATMENT: lifestyle modification   HISTORY OF PRESENT ILLNESS: "Darlene Perez" has a history of prior breast biopsies in 2002 , 2013, and finally 09/10/2014, when an area of distortion at the 1:00 location of the left breast was biopsied and found to be a complex sclerosing lesion ([id number]). She also had an MRI of the breast at [facility] in [place] 08/20/2014 showing a 4.4 cm area of irregular non-masslike enhancement at the 12:00 position of the left breast. She met with a surgeon trying to proceed to lumpectomy before the end of the year for insurance reasons but "things did not work out.   She next underwent bilateral [facility]. This found her breast density to be category C. There was an area of significant architectural distortion in the upper left breast laterally measuring 4.6 cm maximally. This contained a coil shaped biopsy marker centrally. The area of distortion appeared similar to that of mammography June 2015, but more prominent as compared to November 2014. On physical exam there was a 4 cm area of horizontally oriented palpable soft tissue thickening in the left breast at the 12:00 position. The axilla was benign. Targeted ultrasound of the left breast confirmed an irregular poorly defined area of mixed hypoechogenicity measuring 2.7 cm maximally. The left axilla was unremarkable.  The patient was then referred to Dr. Donne Hazel and after appropriate discussion  proceeded to left lumpectomy 07/07/2015. 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 There was no evidence of invasive malignancy.  The patient's subsequent history is as detailed below  INTERVAL HISTORY: Darlene Perez was evaluated in the high-risk clinic 08/26/2015  REVIEW OF SYSTEMS: She had a liver laceration secondary to a an accident in 2005. She also has a history of melanoma removed in 2001 from the right anterior pelvis area and the left calf area. These surgeries were done at [facility] and the patient tells me there were 4 lesions in all, each initially biopsied and then followed by wider excision. One of the lesions required wide excision 2. She states she was using tanning beds at that time, but no longer does so. She is currently not under dermatologic care. She also has a history of migraines, possibly associated with menstruation. She exercises by walking perhaps 15 minutes or 20 minutes [place]. She keeps an "okay diet". A detailed review of systems was otherwise noncontributory  PAST MEDICAL HISTORY: Past Medical History  Diagnosis Date  . PONV (postoperative nausea and vomiting)   . Lesion of breast     complex sclerosing lesion left breast  . Headache     migraines    PAST SURGICAL HISTORY: Past Surgical History  Procedure Laterality Date  . Cesarean section    . Wisdom tooth extraction    . Radioactive seed guided excisional breast biopsy Left 07/07/2015    Procedure: RADIOACTIVE SEED GUIDED EXCISIONAL LEFT BREAST BIOPSY;  Surgeon: Rolm Bookbinder, MD;  Location: [facility];  Service: General;  Laterality: Left;    FAMILY HISTORY No family history on file. The patient's father died at the age of 52 from a heart attack in the setting of tobacco abuse. The patient's mother is living, 33 years old as of November 2016. The patient has one brother, one  sister. There is no history of cancer in the family to the patient's knowledge  GYNECOLOGIC HISTORY:  No LMP recorded. Menarche age 20, first live birth age 29. The patient is GX P1. She still having regular periods. She took oral contraceptives for approximately 26 years but more recently her husband had a vasectomy.  SOCIAL HISTORY:  Darlene Perez works as a Haematologist. Her husband Annie Main is a Charity fundraiser for a company that makes parts for [employer]. Son Tamera Punt, 50 years old as of November 2016, is a Ship broker.    ADVANCED DIRECTIVES: Not in place   HEALTH MAINTENANCE: Social History  Substance Use Topics  . Smoking status: Never Smoker   . Smokeless tobacco: Never Used  . Alcohol Use: Yes     Comment: social     Colonoscopy:  PAP: October 2015  Bone density:  Lipid panel:  No Known Allergies  Current Outpatient Prescriptions  Medication Sig Dispense Refill  . Multiple Vitamin (MULTIVITAMIN WITH MINERALS) TABS tablet Take 1 tablet by mouth daily.     No current facility-administered medications for this visit.    OBJECTIVE: Middle-aged white woman who appears stated age 79 Vitals:   08/26/15 1620  BP: 140/85  Pulse: 85  Temp: 98.7 F (37.1 C)  Resp: 18     Body mass index is 22.26 kg/(m^2).    ECOG FS:0 - Asymptomatic  Ocular: Sclerae unicteric, pupils equal, round and reactive to light Ear-nose-throat: Oropharynx clear and moist Lymphatic: No cervical or supraclavicular adenopathy Lungs no rales or rhonchi, good excursion bilaterally Heart regular rate and rhythm, no murmur appreciated Abd soft, nontender, positive bowel sounds MSK no focal spinal tenderness, no joint edema Neuro: non-focal, well-oriented, appropriate affect Breasts: The right breast is unremarkable. The left breast is status post recent lumpectomy. The incision is healing nicely, with no dehiscence, erythema, or swelling. The cosmetic result is excellent. There are no suspicious findings.  The left axilla is benign. Skin: No suspicious lesion above the waist, which was the area of skin examined  LAB RESULTS:  CMP     Component Value Date/Time   NA 142 08/26/2015 1608   NA 141 07/04/2015 1527   K 3.9 08/26/2015 1608   K 3.9 07/04/2015 1527   CL 104 07/04/2015 1527   CO2 29 08/26/2015 1608   CO2 30 07/04/2015 1527   GLUCOSE 104 08/26/2015 1608   GLUCOSE 85 07/04/2015 1527   BUN 13.5 08/26/2015 1608   BUN 12 07/04/2015 1527   CREATININE 1.1 08/26/2015 1608   CREATININE 0.96 07/04/2015 1527   CALCIUM 9.4 08/26/2015 1608   CALCIUM 9.9 07/04/2015 1527   PROT 6.6 08/26/2015 1608   ALBUMIN 4.0 08/26/2015 1608   AST 15 08/26/2015 1608   ALT 14 08/26/2015 1608   ALKPHOS 74 08/26/2015 1608   BILITOT <0.30 08/26/2015 1608   GFRNONAA >60 07/04/2015 1527   GFRAA >60 07/04/2015 1527    INo results found for: SPEP, UPEP  Lab Results  Component Value Date   WBC 7.0 08/26/2015   NEUTROABS 4.4 08/26/2015   HGB 13.6 08/26/2015   HCT 40.6 08/26/2015   MCV 94.2 08/26/2015   PLT 231 08/26/2015      Chemistry  Component Value Date/Time   NA 142 08/26/2015 1608   NA 141 07/04/2015 1527   K 3.9 08/26/2015 1608   K 3.9 07/04/2015 1527   CL 104 07/04/2015 1527   CO2 29 08/26/2015 1608   CO2 30 07/04/2015 1527   BUN 13.5 08/26/2015 1608   BUN 12 07/04/2015 1527   CREATININE 1.1 08/26/2015 1608   CREATININE 0.96 07/04/2015 1527      Component Value Date/Time   CALCIUM 9.4 08/26/2015 1608   CALCIUM 9.9 07/04/2015 1527   ALKPHOS 74 08/26/2015 1608   AST 15 08/26/2015 1608   ALT 14 08/26/2015 1608   BILITOT <0.30 08/26/2015 1608       No results found for: LABCA2  No components found for: LABCA125  No results for input(s): INR in the last 168 hours.  Urinalysis No results found for: COLORURINE, APPEARANCEUR, LABSPEC, PHURINE, GLUCOSEU, HGBUR, BILIRUBINUR, KETONESUR, PROTEINUR, UROBILINOGEN, NITRITE, LEUKOCYTESUR  STUDIES: No results  found.  ASSESSMENT: 44 y.o. 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 We discussed the terms "atypical ductal hyperplasia" and "lobular carcinoma in situ". She understands these are not cancers, but markers of cancer risk. Given her entire history, and not taking into account breast density, I quoted her a risk of developing invasive breast cancer in the next 5 years of approximately 5% and a lifetime risk of developing invasive breast cancer between 25 and 35%. (The comparable risk for an average woman her age would be approximately 1% and 12%).  We discussed options for risk reduction. These would include bilateral mastectomies or bilateral salpingo-oophorectomy or other forms of ovarian suppression. I feel these options would be "overkill" and did not recommend that. The patient can take tamoxifen, which would reduce her risk by half; she can also have yearly MRIs of the breast in addition to mammography; and she can optimize her lifestyle for cancer risk reduction.  We then discussed those options in detail. MRIs can be expensive and 20% of the cost of a yearly MRI would run into approximately $400 a year. In addition there is the risk of false positives. She was not attracted to this option.  We discussed the possible toxicities, side effects and complications of tamoxifen. She is more open to considering at but at this point she is not ready to make a decision.  Finally we talked about healthy lifestyle's. We have data that for patients who are able to exercise 45 minutes 5 days a week and need a mostly plant-based diet, with meat, fruits, and nuts, but significantly reduced carbohydrates (potatoes, pastor, rice, bread, and sugar in general) there can be a 2-3%  reduction in the risk of cancer in general. Of course his diet is also cardio healthy and significantly reduce his stress.  After all this discussion what she would like to start with is the lifestyle changes. She plans to implement these on her own. If she decides to try the tamoxifen she will let me know and I will place that prescription in for her and then see her approximately 3 months after she starts to make sure she is tolerating it well. I would then follow her yearly for 5 years  I did strongly advise Darlene Perez to hookup with a dermatologist locally and make sure  she gets evaluated at least yearly from top to bottom for melanoma and other skin cancers.  I will be glad to see Darlene Perez at any point in the future as needed, but as of now we are making no further routine appointments for her here. Chauncey Cruel, MD   08/26/2015 5:35 PM Medical Oncology and Hematology [facility] [address] [place], [place] [address] Tel. [phone]    Fax. [phone]

## 2015-12-10 ENCOUNTER — Other Ambulatory Visit: Payer: Self-pay

## 2016-03-30 ENCOUNTER — Other Ambulatory Visit: Payer: Self-pay | Admitting: Obstetrics & Gynecology

## 2016-03-30 ENCOUNTER — Other Ambulatory Visit: Payer: Self-pay

## 2016-03-30 DIAGNOSIS — Z1231 Encounter for screening mammogram for malignant neoplasm of breast: Secondary | ICD-10-CM

## 2016-04-26 ENCOUNTER — Ambulatory Visit
Admission: RE | Admit: 2016-04-26 | Discharge: 2016-04-26 | Disposition: A | Discharge status: Home / Self Care | Payer: BLUE CROSS/BLUE SHIELD | Source: Ambulatory Visit

## 2016-04-26 DIAGNOSIS — Z1231 Encounter for screening mammogram for malignant neoplasm of breast: Secondary | ICD-10-CM

## 2016-09-24 IMAGING — MG MM DIGITAL DIAGNOSTIC UNILAT*L*
2 series · 2 of 2 positions shown · non-contrast
Comparison: Previous exams

CLINICAL DATA: Status post ultrasound-guided core biopsy of
distortion/ mass in the 1 o'clock location of the left breast.

EXAM:
DIAGNOSTIC LEFT MAMMOGRAM POST ULTRASOUND BIOPSY

[L CC]
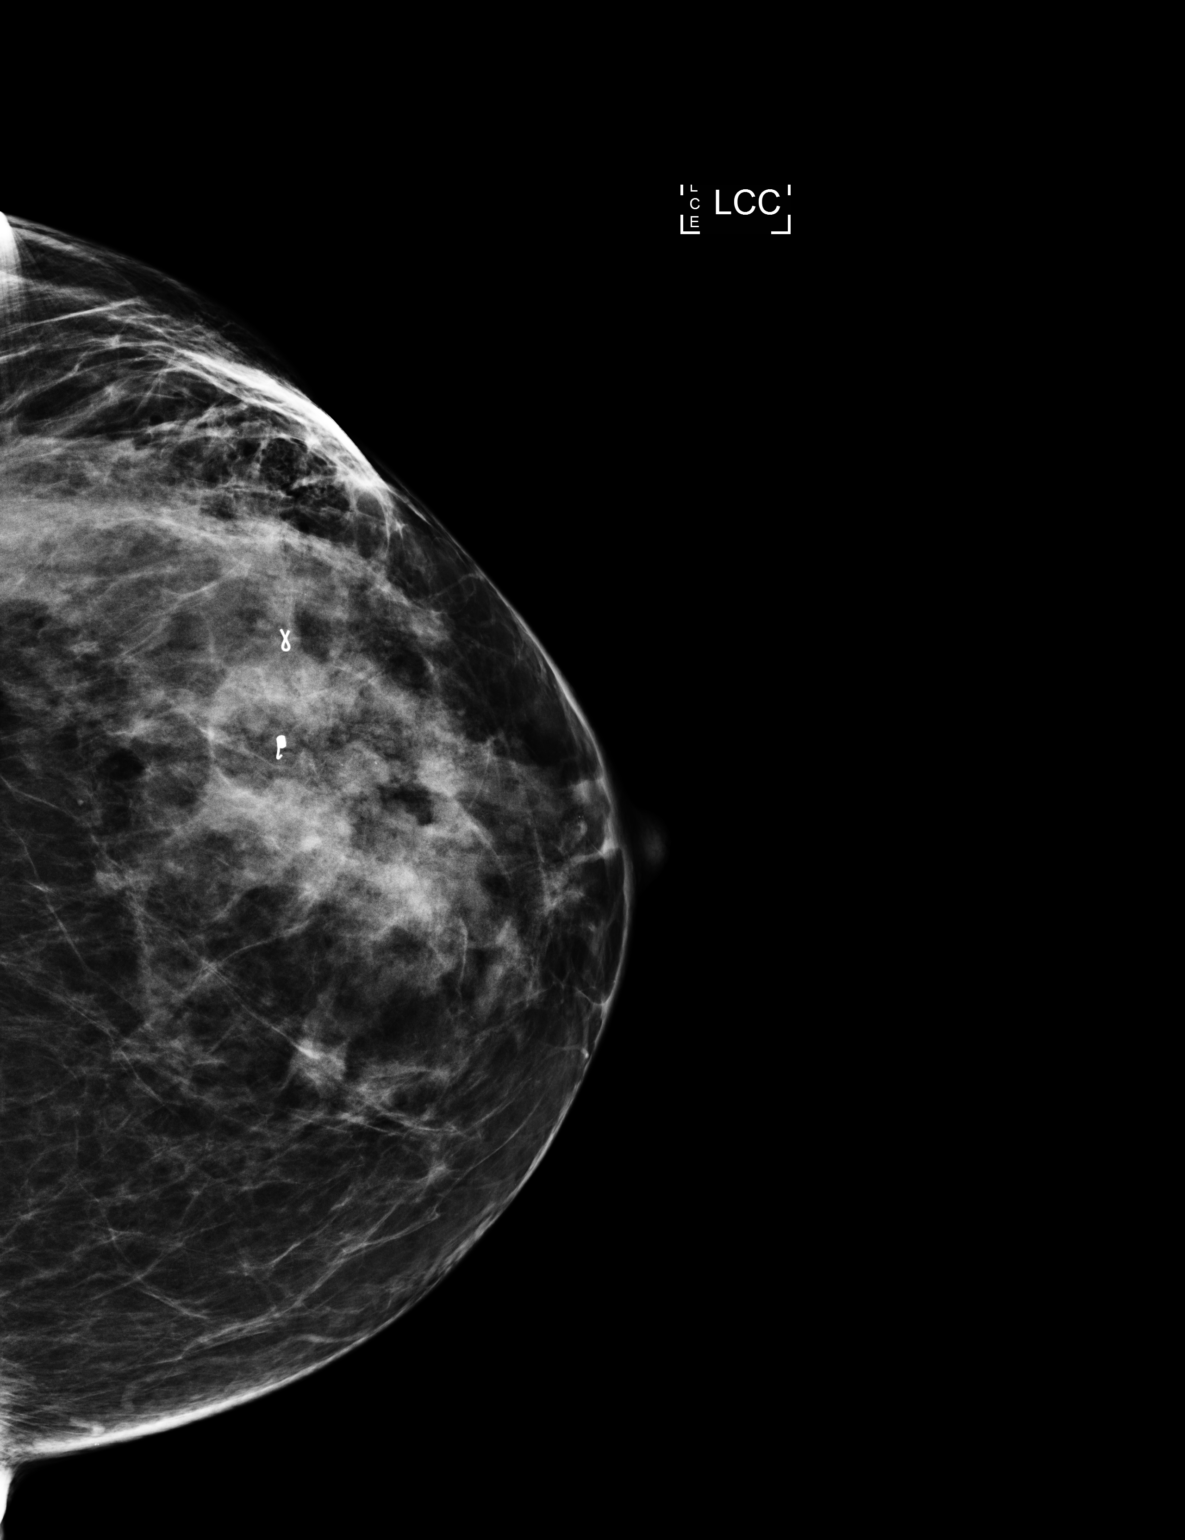

[L ML]
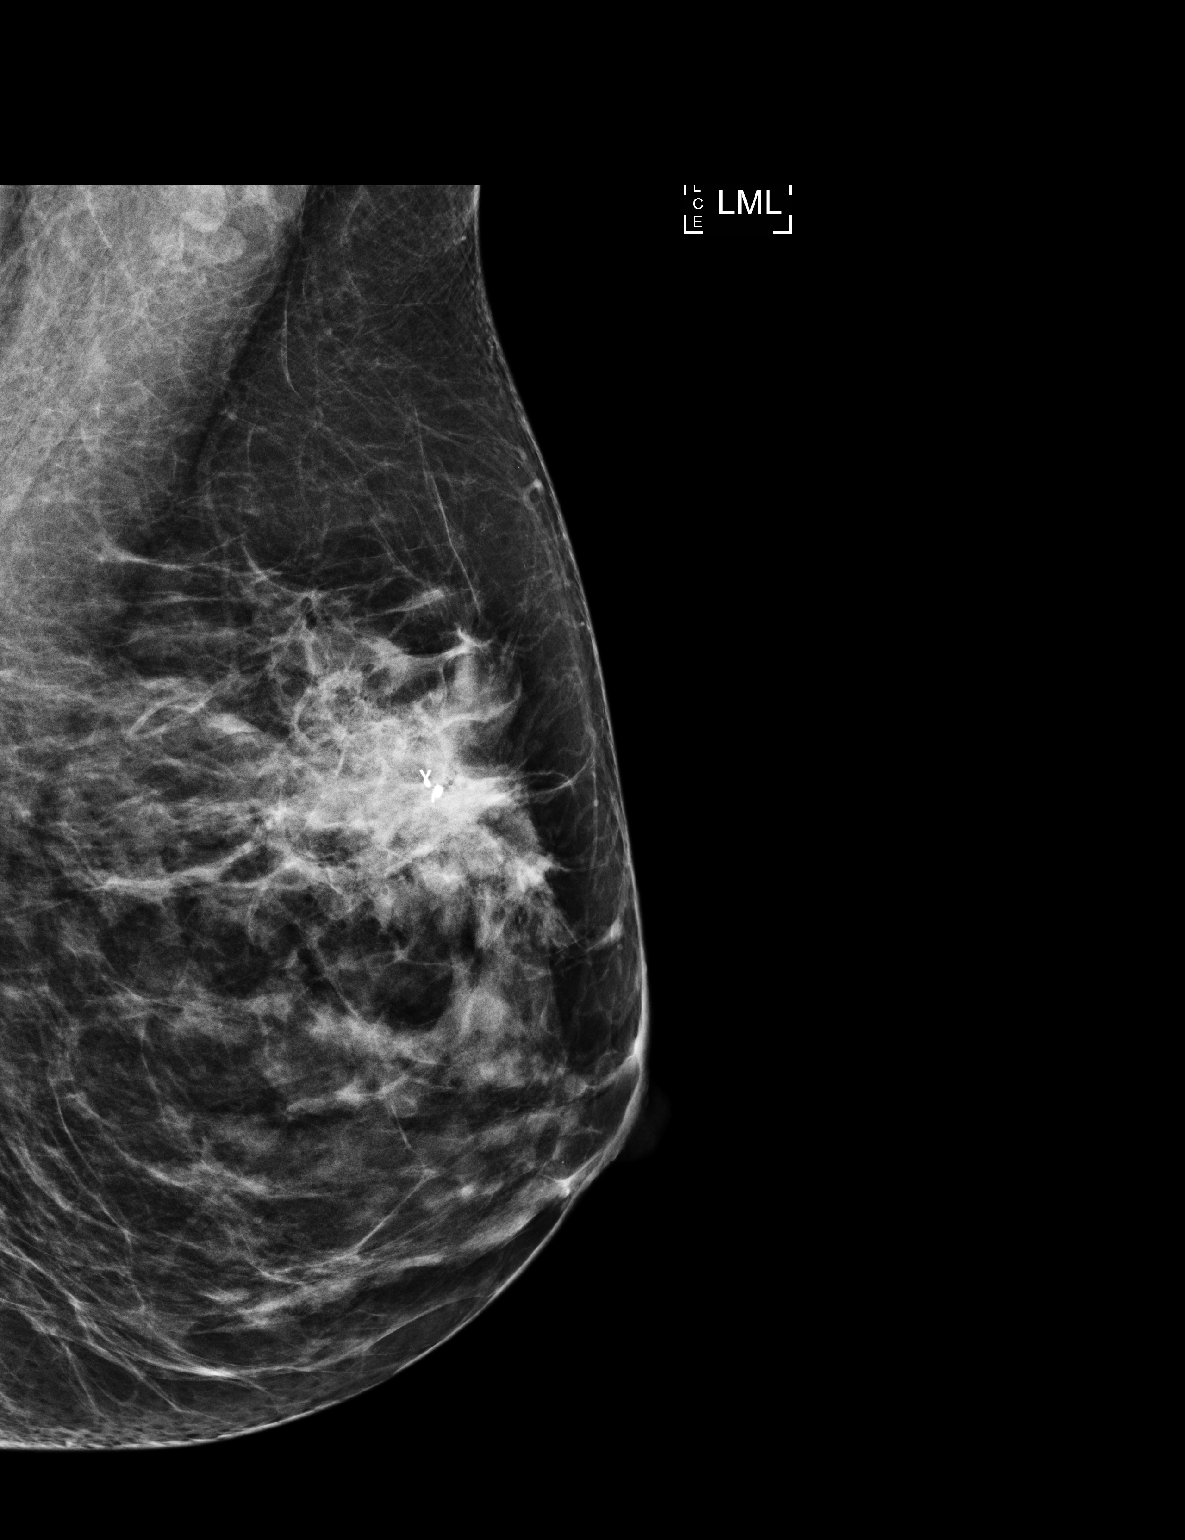

[2 of 2 positions shown; findings below may reference images not displayed]

FINDINGS: Mammographic images were obtained following ultrasound guided biopsy
of distortion/ mass in the 1 o'clock location of the left breast. A
ribbon shaped clip is identified in the upper-outer quadrant of the
left breast as expected. The ribbon shaped clip is 1.5 cm lateral to
the coil shaped clip placed at the time of biopsy in 1470 which
showed florid duct hyperplasia.
IMPRESSION: Tissue marker clip is in expected location following biopsy.

Final Assessment: Post Procedure Mammograms for Marker Placement

## 2016-09-24 IMAGING — US US LT BREAST BX W LOC DEV 1ST LESION IMG BX SPEC US GUIDE
1 series · 13 of 19 positions shown · non-contrast
Comparison: Previous exams.

ADDENDUM:
Pathology revealed a complex sclerosing lesion in the left breast,
for which excision is recommended. This was found to be concordant
by Dr. Hugo Yonny Arzube Z. Pathology was discussed with the patient by
telephone. She reported doing well after the biopsy with tenderness
at the site. Post biopsy instructions and care were reviewed and her
questions were answered. Surgical consultation was previously
scheduled with Dr. Jhemboy Padam and the appointment is today,
September 11, 2014. The patient is aware and will keep the
appointment. My number is provided for future questions and
concerns.

Pathology results reported by Chia Bosquez RN, BSN on September 11, 2014.
CLINICAL DATA: Patient presents for ultrasound-guided core biopsy
of an area of distortion/mass in the left breast.
EXAM:
ULTRASOUND GUIDED LEFT BREAST CORE NEEDLE BIOPSY

[Series 1: us left breast bx w loc dev 1st lesion img bx spec · 13 of 19 slices shown]
[im 1/19]
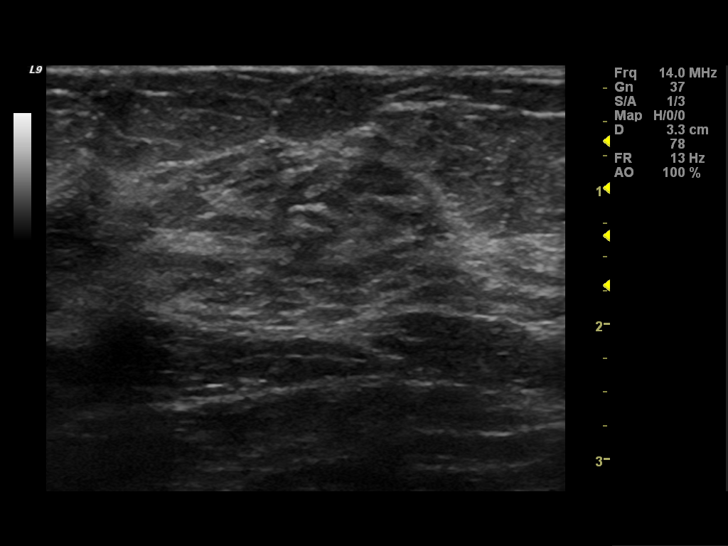
[im 3/19]
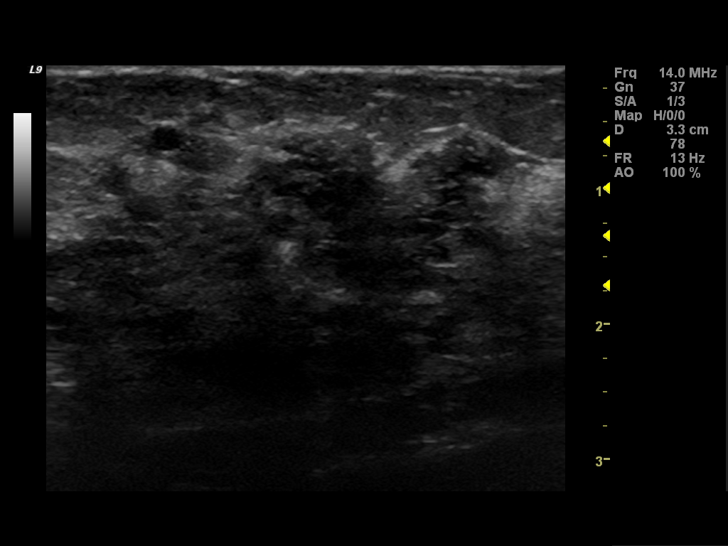
[im 4/19]
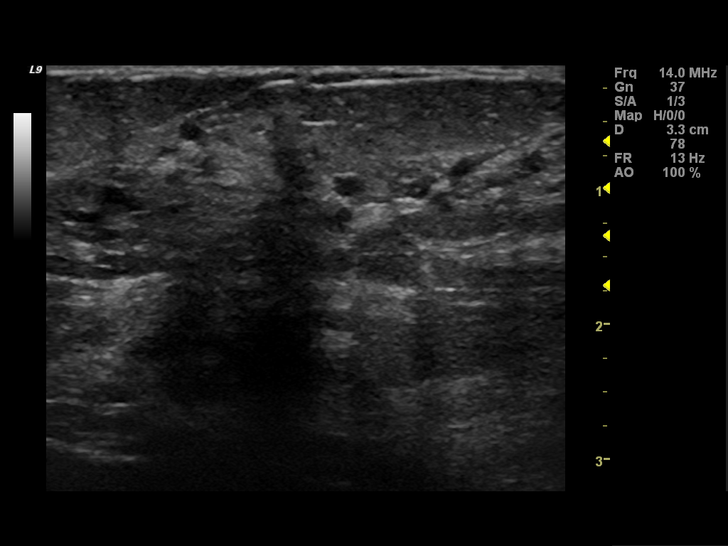
[im 6/19]
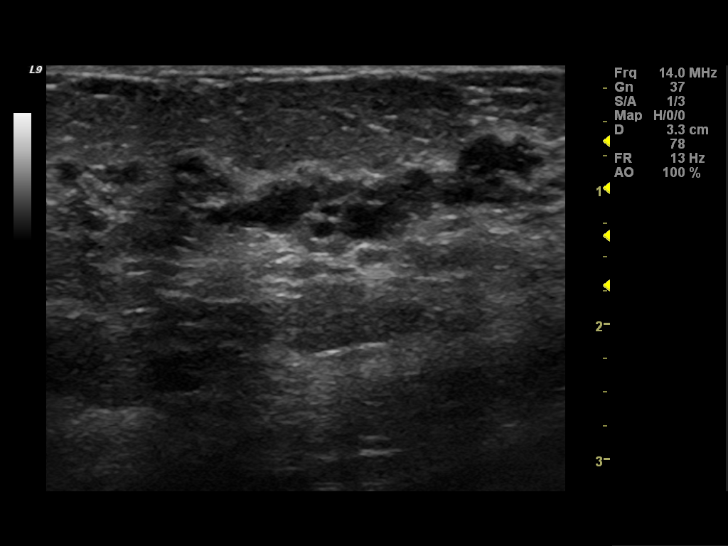
[im 7/19]
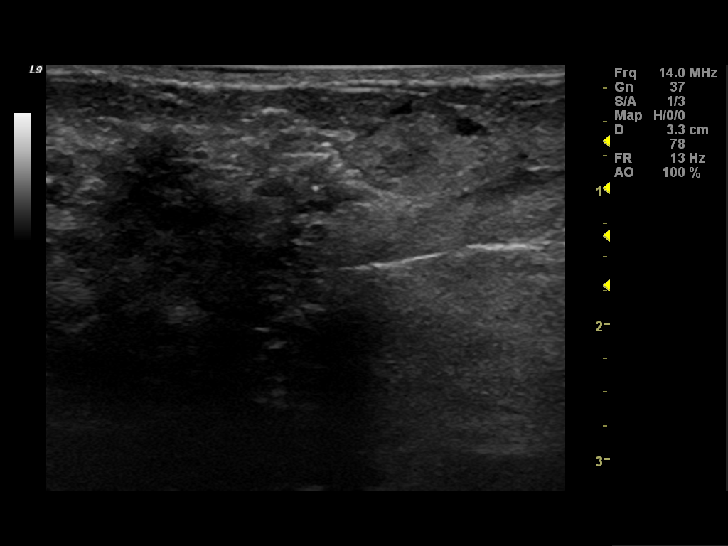
[im 9/19]
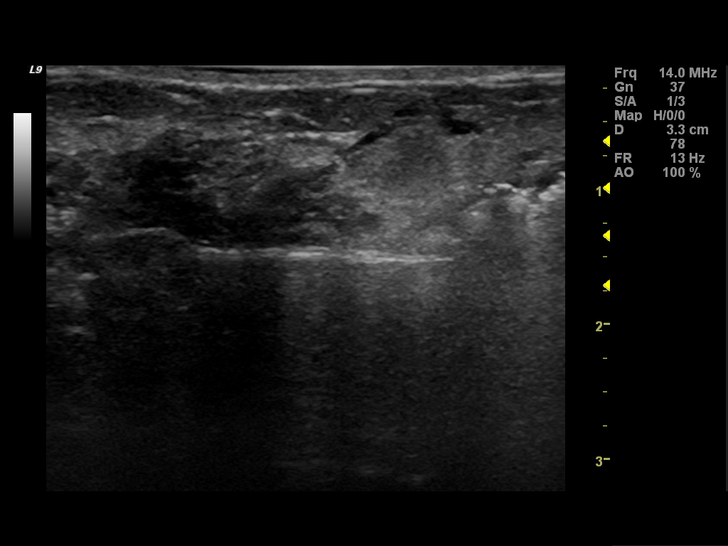
[im 10/19]
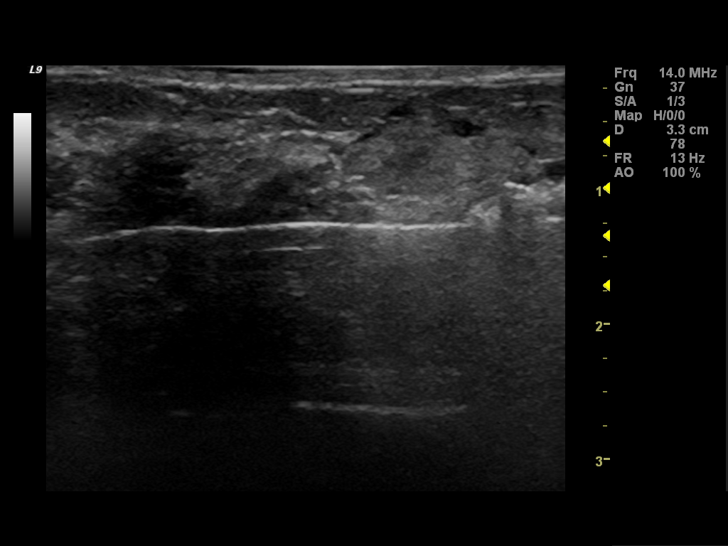
[im 11/19]
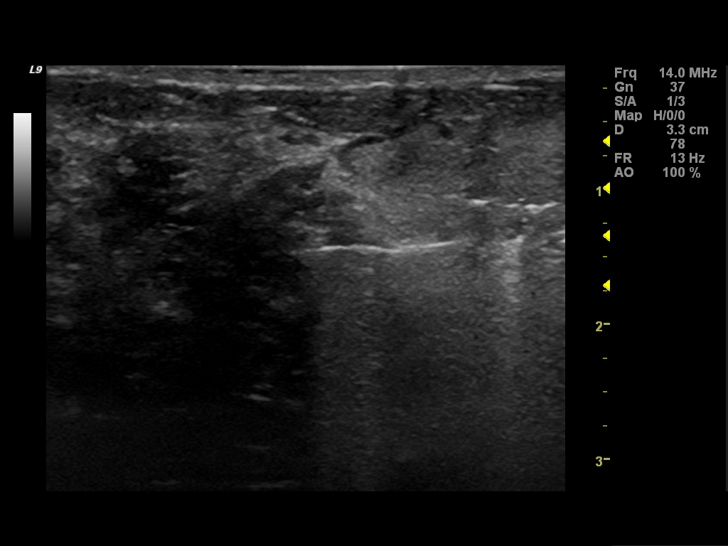
[im 13/19]
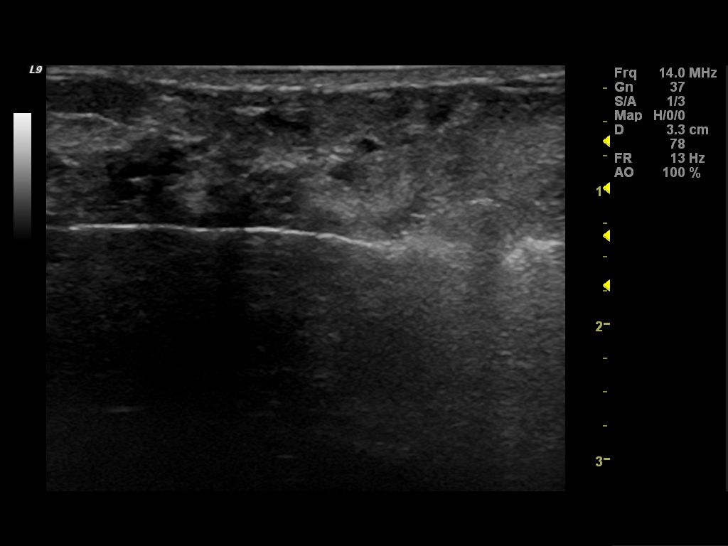
[im 14/19]
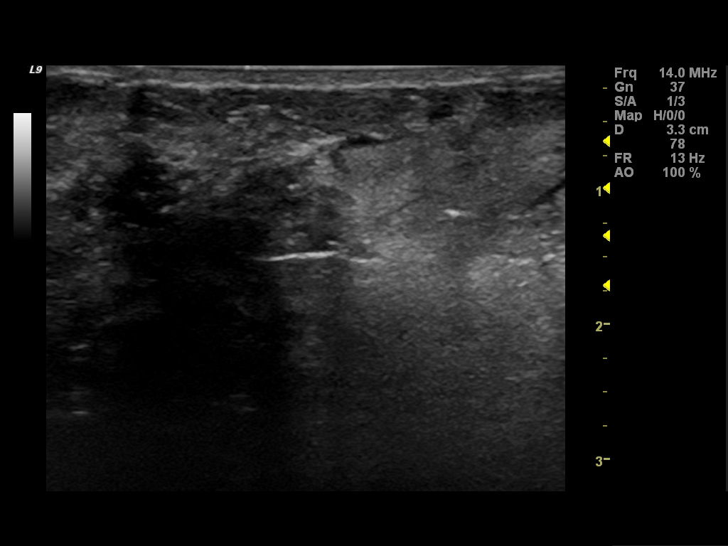
[im 16/19]
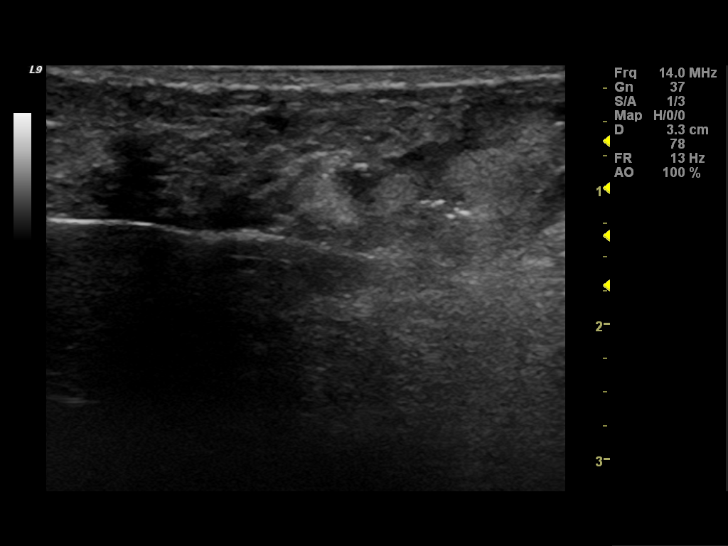
[im 17/19]
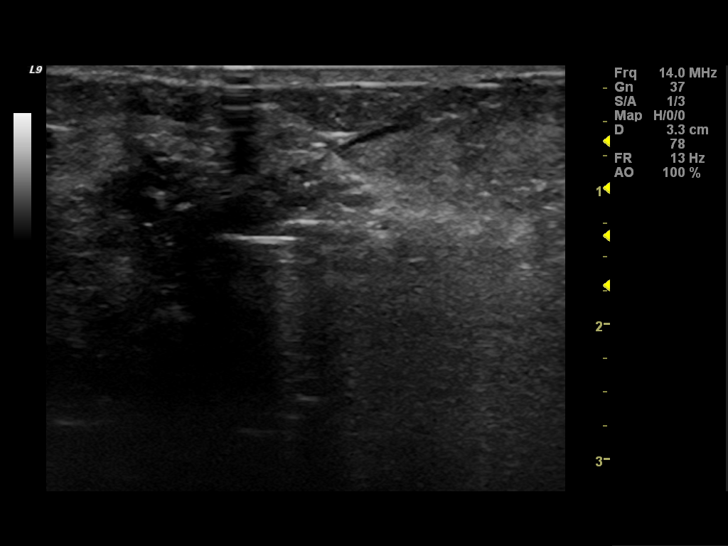
[im 19/19]
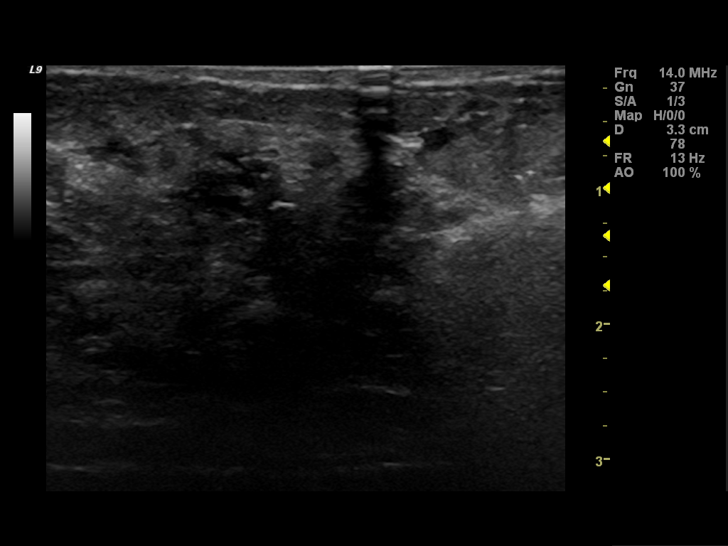

[13 of 19 positions shown; findings below may reference images not displayed]

PROCEDURE:
I met with the patient and we discussed the procedure of
ultrasound-guided biopsy, including benefits and alternatives. We
discussed the high likelihood of a successful procedure. We
discussed the risks of the procedure including infection, bleeding,
tissue injury, clip migration, and inadequate sampling. Informed
written consent was given. The usual time-out protocol was performed
immediately prior to the procedure.

Using sterile technique and 2% Lidocaine as local anesthetic, under
direct ultrasound visualization, a 12 gauge vacuum-assisteddevice
was used to perform biopsy of area of acoustic shadowing/ distortion
in the 1 o'clock location of the left breast using a lateral
approach. At the conclusion of the procedure, a ribbon shaped tissue
marker clip was deployed into the biopsy cavity. Follow-up 2-view
mammogram was performed and dictated separately.
IMPRESSION: Ultrasound-guided biopsy of left breast distortion/mass. No apparent
complications.

## 2017-03-16 ENCOUNTER — Other Ambulatory Visit: Payer: Self-pay | Admitting: Obstetrics & Gynecology

## 2017-03-16 DIAGNOSIS — Z1231 Encounter for screening mammogram for malignant neoplasm of breast: Secondary | ICD-10-CM

## 2017-05-02 ENCOUNTER — Ambulatory Visit
Admission: RE | Admit: 2017-05-02 | Discharge: 2017-05-02 | Disposition: A | Discharge status: Home / Self Care | Payer: BLUE CROSS/BLUE SHIELD | Source: Ambulatory Visit | Attending: Obstetrics & Gynecology | Admitting: Obstetrics & Gynecology

## 2017-05-02 DIAGNOSIS — Z1231 Encounter for screening mammogram for malignant neoplasm of breast: Secondary | ICD-10-CM

## 2018-04-18 ENCOUNTER — Other Ambulatory Visit: Payer: Self-pay | Admitting: Family Medicine

## 2018-04-18 DIAGNOSIS — Z1231 Encounter for screening mammogram for malignant neoplasm of breast: Secondary | ICD-10-CM

## 2018-05-16 ENCOUNTER — Ambulatory Visit
Admission: RE | Admit: 2018-05-16 | Discharge: 2018-05-16 | Disposition: A | Discharge status: Home / Self Care | Payer: BLUE CROSS/BLUE SHIELD | Source: Ambulatory Visit | Attending: Family Medicine | Admitting: Family Medicine

## 2018-05-16 DIAGNOSIS — Z1231 Encounter for screening mammogram for malignant neoplasm of breast: Secondary | ICD-10-CM

## 2018-12-05 ENCOUNTER — Encounter: Payer: Self-pay | Admitting: Obstetrics and Gynecology

## 2018-12-12 ENCOUNTER — Encounter: Payer: Self-pay | Admitting: Obstetrics and Gynecology

## 2018-12-12 ENCOUNTER — Ambulatory Visit: Payer: BLUE CROSS/BLUE SHIELD | Admitting: Obstetrics and Gynecology

## 2018-12-12 ENCOUNTER — Other Ambulatory Visit (HOSPITAL_COMMUNITY)
Admission: RE | Admit: 2018-12-12 | Discharge: 2018-12-12 | Disposition: A | Discharge status: Home / Self Care | Payer: BLUE CROSS/BLUE SHIELD | Source: Ambulatory Visit | Attending: Obstetrics and Gynecology | Admitting: Obstetrics and Gynecology

## 2018-12-12 VITALS — BP 105/67 | HR 86 | Ht 69.0 in | Wt 162.8 lb

## 2018-12-12 DIAGNOSIS — Z01419 Encounter for gynecological examination (general) (routine) without abnormal findings: Secondary | ICD-10-CM

## 2018-12-12 NOTE — Progress Notes (Signed)
Subjective:   Darlene Perez is a 48 y.o. G61P1 Caucasian female here for a routine well-woman exam.  Patient's last menstrual period was 12/04/2018.    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  Past Medical History Past Medical History:  Diagnosis Date  . Headache    migraines  . Lesion of breast    complex sclerosing lesion left breast  . PONV (postoperative nausea and vomiting)     Past Surgical History Past Surgical History:  Procedure Laterality Date  . BREAST EXCISIONAL BIOPSY Left 10/18/2014  . CESAREAN SECTION    . RADIOACTIVE SEED GUIDED EXCISIONAL BREAST BIOPSY Left 07/07/2015   Procedure: RADIOACTIVE SEED GUIDED EXCISIONAL LEFT BREAST BIOPSY;  Surgeon: Rolm Bookbinder, MD;  Location: [facility];  Service: General;  Laterality: Left;  . WISDOM TOOTH EXTRACTION      Gynecologic History G1P1  Patient's last menstrual period was 12/04/2018. Contraception: vasectomy Last Pap: 2018. Results were: normal Last mammogram: 04/2018. Results were: normal   Obstetric History OB History  Gravida Para Term Preterm AB Living  1 1          SAB TAB Ectopic Multiple Live Births               # Outcome Date GA Lbr Len/2nd Weight Sex Delivery Anes PTL Lv  1 Para 2006    M CS-Unspec       Current Medications Current Outpatient Medications on File Prior to Visit  Medication Sig Dispense Refill  . Multiple Vitamin (MULTIVITAMIN WITH MINERALS) TABS tablet Take 1 tablet by mouth daily.      No current facility-administered medications on file prior to visit.     Review of Systems Patient denies any headaches, blurred vision, shortness of breath, chest pain, abdominal pain, problems with bowel movements, urination, or intercourse.  Objective:  BP 105/67   Pulse 86   Ht 5' 9"  (1.753 m)   Wt 162 lb 12.8 oz (73.8 kg)   LMP 12/04/2018   BMI 24.04 kg/m  Physical Exam  General:  Well developed, well nourished, no acute distress. She is alert and oriented x3. Skin:  Warm and dry, red flat rash noted under breasts, scattered and dispersed. Neck:  Midline trachea, no thyromegaly or nodules Cardiovascular: Regular rate and rhythm, no murmur heard Lungs:  Effort normal, all lung fields clear to auscultation bilaterally Breasts:  No dominant palpable mass, retraction, or nipple discharge Abdomen:  Soft, non tender, no hepatosplenomegaly or masses Pelvic:  External genitalia is normal in appearance.  The vagina is normal in appearance. The cervix is bulbous, no CMT.  Thin prep pap is done with HR HPV cotesting. Uterus is felt to be normal size, shape, and contour.  No adnexal masses or tenderness noted. Extremities:  No swelling or varicosities noted Psych:  She has a normal mood and affect  Assessment:   Healthy well-woman exam Contact dermatitis under breast  Plan:  Labs obtained-will follow accordingly Recommended cortaid ointment for contact dermatitis under breast. F/U 1-2 years for AE, or sooner if needed Mammogram  due in July  Melody N [place], [place]

## 2018-12-13 LAB — COMPREHENSIVE METABOLIC PANEL
A/G RATIO: 2.2 (ref 1.2–2.2)
ALT: 16 IU/L (ref 0–32)
AST: 13 IU/L (ref 0–40)
Albumin: 4.4 g/dL (ref 3.8–4.8)
Alkaline Phosphatase: 72 IU/L (ref 39–117)
BUN/Creatinine Ratio: 13 (ref 9–23)
BUN: 11 mg/dL (ref 6–24)
Bilirubin Total: <0.2 mg/dL (ref 0.0–1.2)
CHLORIDE: 101 mmol/L (ref 96–106)
CO2: 28 mmol/L (ref 20–29)
Calcium: 9.5 mg/dL (ref 8.7–10.2)
Creatinine, Ser: 0.85 mg/dL (ref 0.57–1.00)
GFR calc non Af Amer: 82 mL/min/{1.73_m2} (ref >59)
GFR, EST AFRICAN AMERICAN: 95 mL/min/{1.73_m2} (ref >59)
GLUCOSE: 96 mg/dL (ref 65–99)
Globulin, Total: 2 g/dL (ref 1.5–4.5)
POTASSIUM: 4.3 mmol/L (ref 3.5–5.2)
Sodium: 140 mmol/L (ref 134–144)
TOTAL PROTEIN: 6.4 g/dL (ref 6.0–8.5)

## 2018-12-13 LAB — HEMOGLOBIN A1C
ESTIMATED AVERAGE GLUCOSE: 105 mg/dL
HEMOGLOBIN A1C: 5.3 % (ref 4.8–5.6)

## 2018-12-13 LAB — TSH: TSH: 2.51 u[IU]/mL (ref 0.450–4.500)

## 2018-12-13 LAB — CBC
Hematocrit: 40.4 % (ref 34.0–46.6)
Hemoglobin: 13.7 g/dL (ref 11.1–15.9)
MCH: 31.4 pg (ref 26.6–33.0)
MCHC: 33.9 g/dL (ref 31.5–35.7)
MCV: 92 fL (ref 79–97)
PLATELETS: 257 10*3/uL (ref 150–450)
RBC: 4.37 x10E6/uL (ref 3.77–5.28)
RDW: 11.9 % (ref 11.7–15.4)
WBC: 5.7 10*3/uL (ref 3.4–10.8)

## 2018-12-13 LAB — LIPID PANEL
CHOL/HDL RATIO: 5.7 ratio — AB (ref 0.0–4.4)
Cholesterol, Total: 204 mg/dL — ABNORMAL HIGH (ref 100–199)
HDL: 36 mg/dL — AB (ref >39)
LDL CALC: 107 mg/dL — AB (ref 0–99)
Triglycerides: 304 mg/dL — ABNORMAL HIGH (ref 0–149)
VLDL CHOLESTEROL CAL: 61 mg/dL — AB (ref 5–40)

## 2018-12-13 LAB — VITAMIN D 25 HYDROXY (VIT D DEFICIENCY, FRACTURES): VIT D 25 HYDROXY: 32.5 ng/mL (ref 30.0–100.0)

## 2018-12-14 LAB — CYTOLOGY - PAP
ADEQUACY: ABSENT
DIAGNOSIS: NEGATIVE
HPV (WINDOPATH): NOT DETECTED

## 2019-03-27 ENCOUNTER — Other Ambulatory Visit: Payer: Self-pay | Admitting: Family Medicine

## 2019-03-27 DIAGNOSIS — Z1231 Encounter for screening mammogram for malignant neoplasm of breast: Secondary | ICD-10-CM

## 2019-05-21 ENCOUNTER — Other Ambulatory Visit: Payer: Self-pay

## 2019-05-21 ENCOUNTER — Ambulatory Visit
Admission: RE | Admit: 2019-05-21 | Discharge: 2019-05-21 | Disposition: A | Discharge status: Home / Self Care | Payer: BLUE CROSS/BLUE SHIELD | Source: Ambulatory Visit | Attending: Family Medicine | Admitting: Family Medicine

## 2019-05-21 DIAGNOSIS — Z1231 Encounter for screening mammogram for malignant neoplasm of breast: Secondary | ICD-10-CM
